# Patient Record
Sex: Male | Born: 1990 | Race: White | Marital: Single | Smoking: Never smoker
Health system: Southern US, Community
[De-identification: ages and names within clinical notes are randomized; demographics above are authoritative.]

## PROBLEM LIST (undated history)

## (undated) DIAGNOSIS — B1920 Unspecified viral hepatitis C without hepatic coma: Secondary | ICD-10-CM

## (undated) DIAGNOSIS — F1910 Other psychoactive substance abuse, uncomplicated: Secondary | ICD-10-CM

## (undated) HISTORY — DX: Unspecified viral hepatitis C without hepatic coma: B19.20

## (undated) HISTORY — DX: Other psychoactive substance abuse, uncomplicated: F19.10

---

## 2015-05-25 ENCOUNTER — Ambulatory Visit (INDEPENDENT_AMBULATORY_CARE_PROVIDER_SITE_OTHER): Payer: Managed Care, Other (non HMO) | Admitting: Family Medicine

## 2015-05-25 VITALS — BP 118/72 | HR 81 | Temp 98.7°F | Resp 16 | Ht 72.0 in | Wt 202.0 lb

## 2015-05-25 DIAGNOSIS — B1710 Acute hepatitis C without hepatic coma: Secondary | ICD-10-CM

## 2015-05-25 LAB — COMPREHENSIVE METABOLIC PANEL
ALT: 88 U/L — ABNORMAL HIGH (ref 9–46)
AST: 45 U/L — ABNORMAL HIGH (ref 10–40)
Albumin: 4.5 g/dL (ref 3.6–5.1)
Alkaline Phosphatase: 89 U/L (ref 40–115)
BUN: 11 mg/dL (ref 7–25)
CO2: 24 mmol/L (ref 20–31)
Calcium: 9.1 mg/dL (ref 8.6–10.3)
Chloride: 105 mmol/L (ref 98–110)
Creat: 0.89 mg/dL (ref 0.60–1.35)
Glucose, Bld: 83 mg/dL (ref 65–99)
Potassium: 4.5 mmol/L (ref 3.5–5.3)
Sodium: 138 mmol/L (ref 135–146)
Total Bilirubin: 0.8 mg/dL (ref 0.2–1.2)
Total Protein: 7.4 g/dL (ref 6.1–8.1)

## 2015-05-25 NOTE — Patient Instructions (Signed)
We will set up hepatitis C evaluation with a specialist. Lab results should be back in 5 days.

## 2015-05-25 NOTE — Progress Notes (Signed)
° °  Subjective:    Patient ID: Carlos Mitchell, male    DOB: May 03, 1991, 24 y.o.   MRN: [id number] This chart was scribed for Elvina Sidle, MD by Littie Deeds, Medical Scribe. This patient was seen in Room 3 and the patient's care was started at 11:54 AM.   HPI HPI Comments: Carlos Mitchell is a 24 y.o. male who presents to the [facility] for a referral. Patient was diagnosed with hepatitis C 4 years ago and presents lab reports confirming hepatitis C in an active state. He would like a referral so he can begin medications. He is in a [employer], [employer]. He states he was seeing a specialist about 2 years ago in [place] and was about to start medications, but he stopped following up on his appointments due to drug relapse.   Review of Systems     Objective:   Physical Exam CONSTITUTIONAL: Well developed/well nourished. Patient has multiple tattoos. HEAD: Normocephalic/atraumatic EYES: EOM/PERRL ENMT: Mucous membranes moist NECK: supple no meningeal signs SPINE: entire spine nontender CV: S1/S2 noted, no murmurs/rubs/gallops noted LUNGS: Lungs are clear to auscultation bilaterally, no apparent distress ABDOMEN: soft, nontender, no rebound or guarding GU: no cva tenderness NEURO: Pt is awake/alert, moves all extremitiesx4 EXTREMITIES: pulses normal, full ROM SKIN: warm, color normal PSYCH: no abnormalities of mood noted        Assessment & Plan:    By signing my name below, I, Littie Deeds, attest that this documentation has been prepared under the direction and in the presence of Elvina Sidle, MD.  Electronically Signed: Littie Deeds, Medical Scribe. 05/25/2015. 11:54 AM.   This chart was scribed in my presence and reviewed by me personally.    ICD-9-CM ICD-10-CM   1. Acute hepatitis C virus infection without hepatic coma 070.51 B17.10 Comprehensive metabolic panel     Hepatitis C antibody     Hepatitis C Genotype     Ambulatory referral to  Gastroenterology     Signed, Elvina Sidle, MD

## 2015-05-26 LAB — HEPATITIS C ANTIBODY: HCV Ab: REACTIVE — AB

## 2015-05-28 LAB — HEPATITIS C RNA QUANTITATIVE
HCV Quantitative Log: 5.52 {Log} — ABNORMAL HIGH (ref <1.18)
HCV Quantitative: 329992 IU/mL — ABNORMAL HIGH (ref <15)

## 2015-05-30 LAB — HEPATITIS C GENOTYPE

## 2015-06-01 ENCOUNTER — Telehealth: Payer: Self-pay

## 2015-06-01 NOTE — Telephone Encounter (Signed)
You may tell mother that Carlos Mitchell was seen here requesting tests on his liver.  We sent him the results

## 2015-06-01 NOTE — Telephone Encounter (Signed)
Mom called requesting result. Pt did not fill out a HIPPAA form to authorize to speak with her. She states the pt is in rehab and I would have to release the results to her. What should I do?

## 2015-06-13 ENCOUNTER — Other Ambulatory Visit (HOSPITAL_COMMUNITY): Payer: Self-pay | Admitting: Nurse Practitioner

## 2015-06-13 DIAGNOSIS — B182 Chronic viral hepatitis C: Secondary | ICD-10-CM

## 2015-07-05 ENCOUNTER — Ambulatory Visit (HOSPITAL_COMMUNITY): Payer: Managed Care, Other (non HMO)

## 2015-07-24 ENCOUNTER — Ambulatory Visit
Admission: RE | Admit: 2015-07-24 | Discharge: 2015-07-24 | Disposition: A | Discharge status: Home / Self Care | Payer: Managed Care, Other (non HMO) | Source: Ambulatory Visit | Attending: Nurse Practitioner | Admitting: Nurse Practitioner

## 2015-07-24 DIAGNOSIS — B182 Chronic viral hepatitis C: Secondary | ICD-10-CM | POA: Diagnosis present

## 2015-07-31 ENCOUNTER — Ambulatory Visit (HOSPITAL_COMMUNITY): Payer: Managed Care, Other (non HMO)

## 2015-09-25 ENCOUNTER — Ambulatory Visit (INDEPENDENT_AMBULATORY_CARE_PROVIDER_SITE_OTHER): Payer: Managed Care, Other (non HMO) | Admitting: Physician Assistant

## 2015-09-25 ENCOUNTER — Telehealth: Payer: Self-pay

## 2015-09-25 DIAGNOSIS — B1920 Unspecified viral hepatitis C without hepatic coma: Secondary | ICD-10-CM | POA: Insufficient documentation

## 2015-09-25 DIAGNOSIS — N5089 Other specified disorders of the male genital organs: Secondary | ICD-10-CM | POA: Diagnosis not present

## 2015-09-25 DIAGNOSIS — F1911 Other psychoactive substance abuse, in remission: Secondary | ICD-10-CM | POA: Insufficient documentation

## 2015-09-25 MED ORDER — VALACYCLOVIR HCL 1 G PO TABS: ORAL_TABLET | ORAL | Status: AC

## 2015-09-25 MED ORDER — VALACYCLOVIR HCL 1 G PO TABS: ORAL_TABLET | ORAL | Status: DC

## 2015-09-25 NOTE — Telephone Encounter (Signed)
Patient requested that we direct all lab calls to his counselor Debbora Presto [phone]). Deniece Portela is listed on patient release form

## 2015-09-25 NOTE — Telephone Encounter (Signed)
Carlos Mitchell,  I am sending this to you since you last saw the patient and did his labs.

## 2015-09-25 NOTE — Patient Instructions (Signed)

## 2015-09-25 NOTE — Progress Notes (Signed)
   Carlos Mitchell  MRN: [id number] DOB: 05-Feb-1991  Subjective:  Pt presents to clinic with concerns that he might have genital herpes.  4 years ago he had a lesion that he went to the HD for and he was told it was nothing to worry about.  Since then has had similar lesions about every 3-4 months - he gets a funny sensation and then he gets a bump that when it is busted clear fluid comes out.  It lasts for several days and then resolves.  He has been with many partners when he was using drugs, he has been with his current partner for close to 4 years and she has never had an outbreak.  His last outbreak was a week ago and current his lesion is completely healed over.  He has had other STD which has been negative and he is not wishing to have additional testing today.  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  Patient Active Problem List   Diagnosis Date Noted  . Hepatitis C 09/25/2015  . History of substance abuse 09/25/2015    No current outpatient prescriptions on file prior to visit.   No current facility-administered medications on file prior to visit.    No Known Allergies  Review of Systems Objective:  BP 124/80 mmHg  Pulse 83  Temp(Src) 97.9 F (36.6 C) (Oral)  Resp 20  Ht  (1.803 m)  Wt 203 lb 12.8 oz (92.443 kg)  BMI 28.44 kg/m2  SpO2 98%  Physical Exam  Constitutional: He is oriented to person, place, and time and well-developed, well-nourished, and in no distress.  HENT:  Head: Normocephalic and atraumatic.  Right Ear: External ear normal.  Left Ear: External ear normal.  Eyes: Conjunctivae are normal.  Neck: Normal range of motion.  Pulmonary/Chest: Effort normal.  Neurological: He is alert and oriented to person, place, and time. Gait normal.  Skin: Skin is warm and dry.  Psychiatric: Mood, memory, affect and judgment normal.    Assessment and Plan :  Genital lesion, male - Plan: Care order/instruction, HSV(herpes  simplex vrs) 1+2 ab-IgG, valACYclovir (VALTREX) 1000 MG tablet  D/w pt at length diagnosis - what this means for his current symptoms - we will get labs back - we discussed how and when to take the medications.  [phone] [id number] Debbora Presto - counselor  Carlos Lennert PA-C  [facility] 09/25/2015 3:57 PM

## 2015-09-26 LAB — HSV(HERPES SIMPLEX VRS) I + II AB-IGG: HSV 2 GLYCOPROTEIN G AB, IGG: 5.78 IV — AB

## 2015-09-26 NOTE — Telephone Encounter (Signed)
Please call his counselor and see if you can speak with the patient - if you are unable (he is in treatment facility and may not be able to come to the phone) - he gave permission to speak to his counselor (see name and contact info below).  He was positive for HSV 2 - we talked about treatment options and he was given a Rx.  I am more than happy to speak with him when I return next week if he has further questions that you are unable to answer.

## 2015-10-01 NOTE — Telephone Encounter (Signed)
lmom to cb. 

## 2015-10-01 NOTE — Telephone Encounter (Signed)
Pt notified of results

## 2017-09-10 IMAGING — US US ABDOMEN COMPLETE W/ ELASTOGRAPHY
2 series · 13 of 25 positions shown · non-contrast
Comparison: None.

CLINICAL DATA: Chronic hepatitis-C.



[Series 1: us abdomen complete w/ elastography · 0.22mm/px · 11 of 86 slices shown (1 of 2)]
[im 1/86]
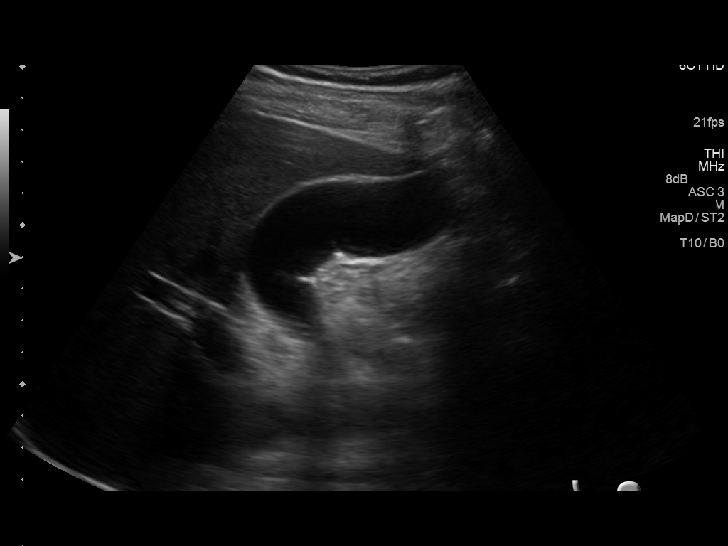
[im 9/86]
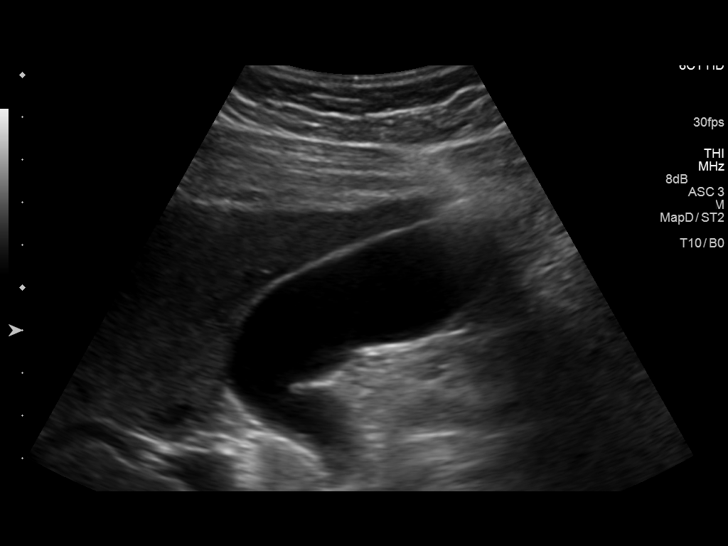
[im 17/86]
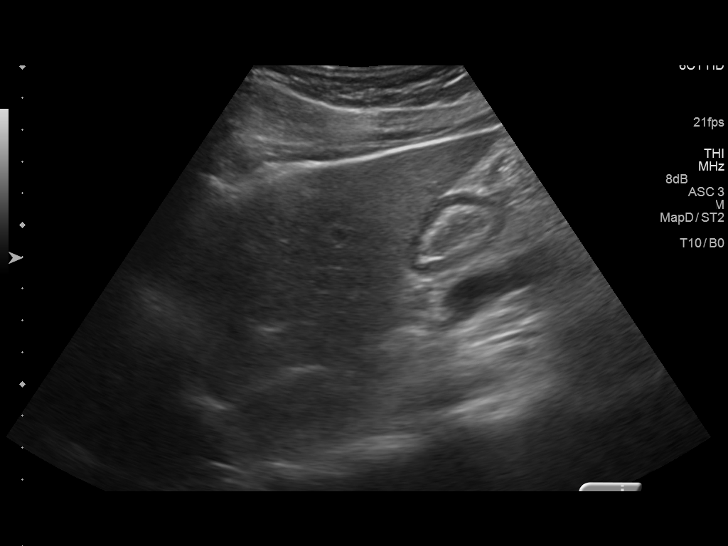
[im 25/86]
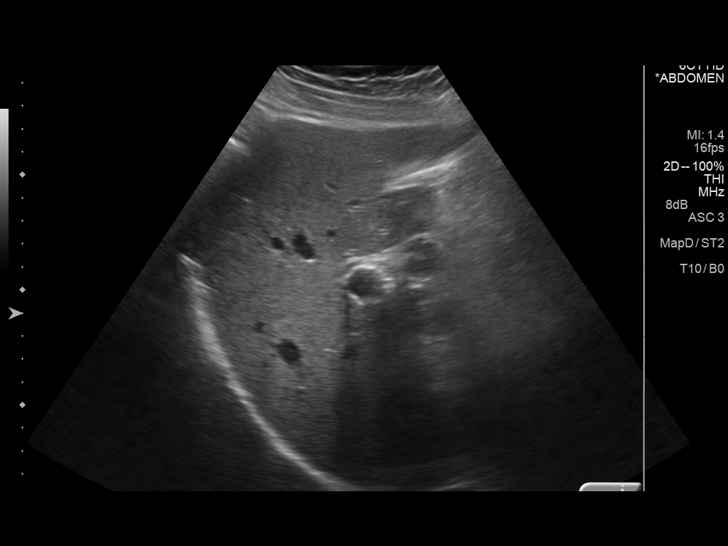
[im 33/86]
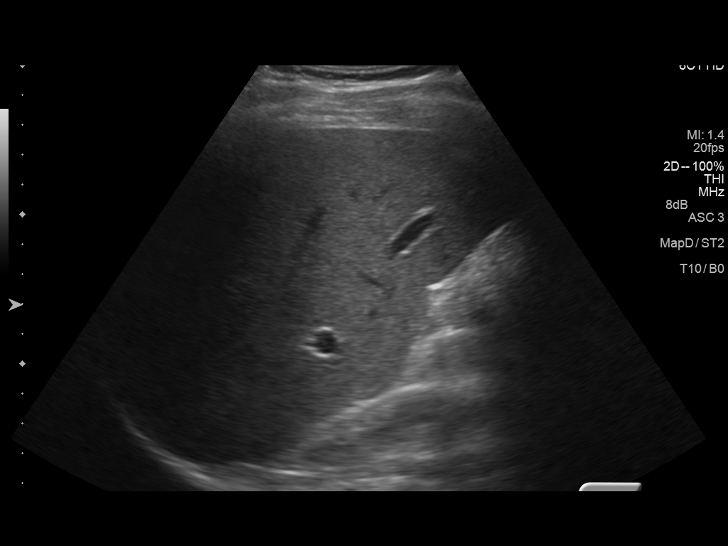
[im 41/86]
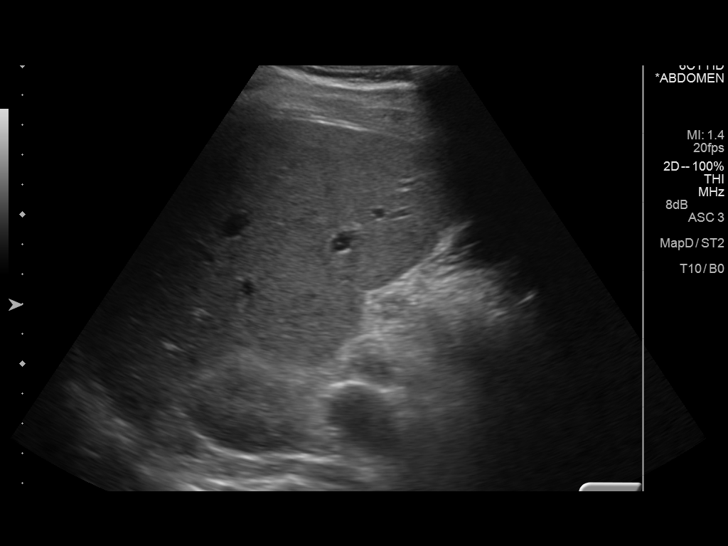
[im 49/86]
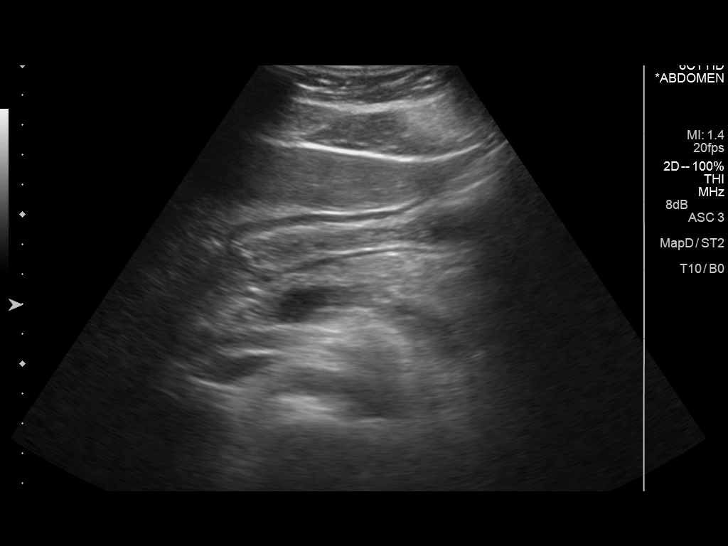
[im 57/86]
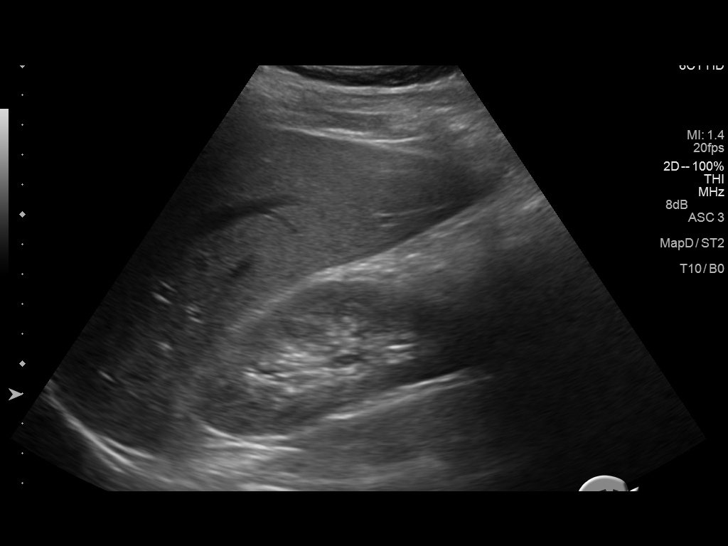
[im 65/86]
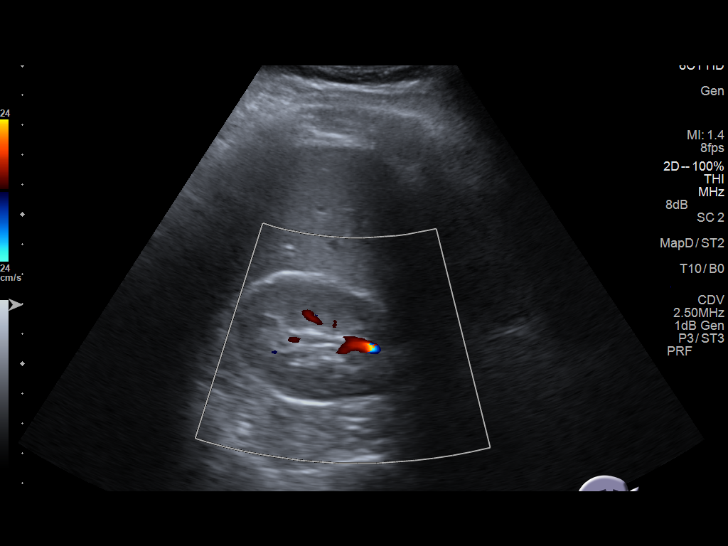
[im 73/86]
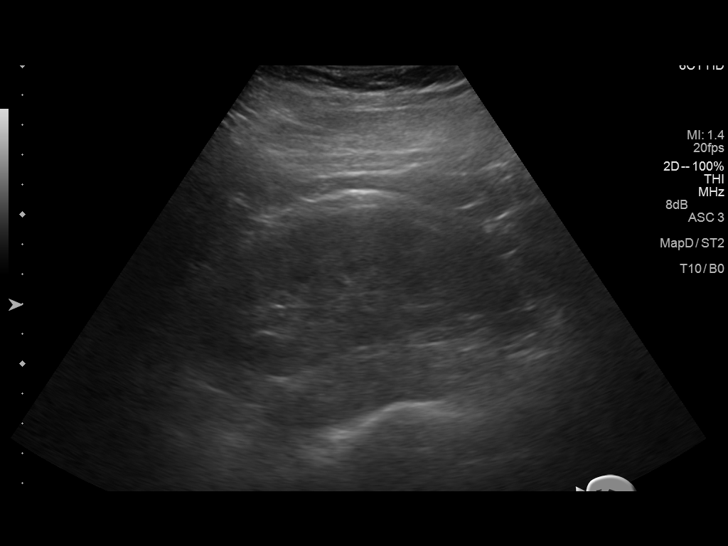
[im 81/86]
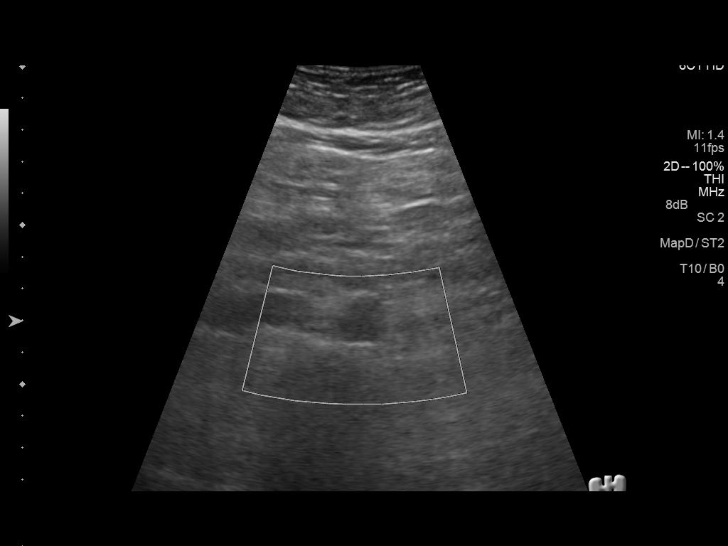

[Series 2001: us abdomen complete w/ elastography · 0.24mm/px · 2 of 11 slices shown (2 of 2)]
[im 1/11]
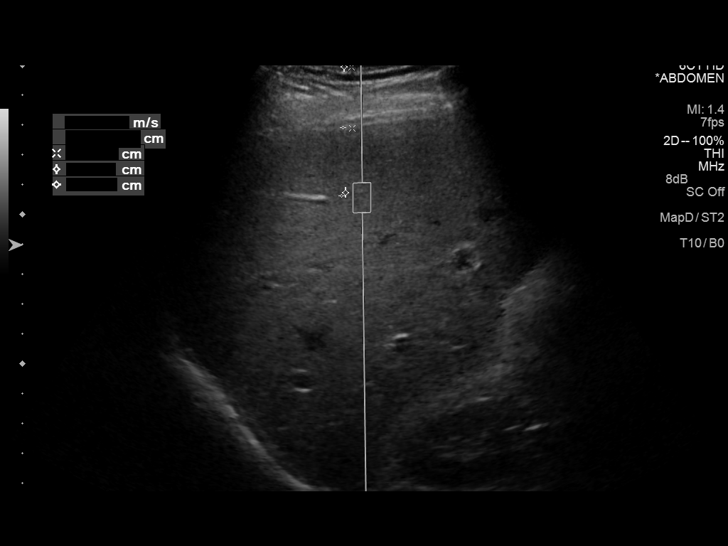
[im 11/11]
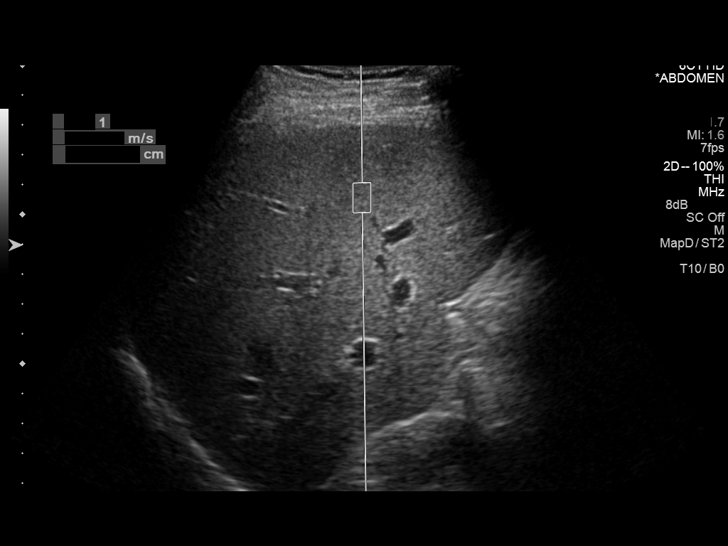

[13 of 25 positions shown; findings below may reference images not displayed]

FINDINGS: ULTRASOUND ABDOMEN

Gallbladder: No gallstones or wall thickening visualized. No
sonographic Murphy sign noted.

Common bile duct: Diameter: 5 mm, within normal limits.

Liver: No focal lesion identified. Within normal limits in
parenchymal echogenicity.

IVC: No abnormality visualized.

Pancreas: Visualized portion unremarkable.

Spleen: Size and appearance within normal limits.

Right Kidney: Length: 9.9 cm. Echogenicity within normal limits. No
mass or hydronephrosis visualized.

Left Kidney: Length: 10.8 cm. Echogenicity within normal limits. No
mass or hydronephrosis visualized.

Abdominal aorta: No aneurysm visualized.

Other findings: None.

ULTRASOUND HEPATIC ELASTOGRAPHY

Device: Siemens Helix VTQ

Transducer 6 C1

Patient position: 10

Number of measurements:  10

Hepatic Segment:  8

Median velocity:   0.87  m/sec

IQR:

IQR/Median velocity ratio

Corresponding Metavir fibrosis score:  F0/F1

Risk of fibrosis: Minimal

Limitations of exam: None

Pertinent findings noted on other imaging exams:  None

Please note that abnormal shear wave velocities may also be
identified in clinical settings other than with hepatic fibrosis,
such as: acute hepatitis, elevated right heart and central venous
pressures including use of beta blockers, Christen disease
([place]), infiltrative processes such as
mastocytosis/amyloidosis/infiltrative tumor, extrahepatic
cholestasis, in the post-prandial state, and liver transplantation.
Correlation with patient history, laboratory data, and clinical
condition recommended.
IMPRESSION: Negative abdominal ultrasound. No hepatobiliary or other sonographic
abnormality identified.

Median hepatic shear wave velocity is calculated at 0.87 m/sec.

Corresponding Metavir fibrosis score is F0/F1.

Risk of fibrosis is minimal.

Follow-up:  None required

## 2019-06-02 DEATH — deceased
